# Patient Record
Sex: Male | Born: 1955 | Race: Black or African American | Hispanic: No | Marital: Married | Smoking: Former smoker
Health system: Southern US, Community
[De-identification: ages and names within clinical notes are randomized; demographics above are authoritative.]

## PROBLEM LIST (undated history)

## (undated) DIAGNOSIS — E119 Type 2 diabetes mellitus without complications: Secondary | ICD-10-CM

## (undated) DIAGNOSIS — I10 Essential (primary) hypertension: Secondary | ICD-10-CM

---

## 2007-10-28 ENCOUNTER — Ambulatory Visit: Payer: Self-pay | Admitting: Internal Medicine

## 2007-11-07 ENCOUNTER — Ambulatory Visit: Payer: Self-pay | Admitting: Unknown Physician Specialty

## 2007-11-23 ENCOUNTER — Ambulatory Visit: Payer: Self-pay | Admitting: Unknown Physician Specialty

## 2007-12-24 ENCOUNTER — Ambulatory Visit: Payer: Self-pay | Admitting: Unknown Physician Specialty

## 2009-06-11 ENCOUNTER — Emergency Department: Payer: Self-pay | Admitting: Emergency Medicine

## 2009-08-17 IMAGING — CT CT ABD-PELV W/O CM
1 of 2 series · 16 of 32 positions shown, 20 images · non-contrast
Comparison: none

REASON FOR EXAM: Right side pain, back pain, stones
   Call report to: [phone]
COMMENTS:

[Series 2: stone · axial · 0.82mm/px · z∈[-606,-138]mm · 16 of 170 slices shown, 20 images]
[im 7/170  soft-tissue]
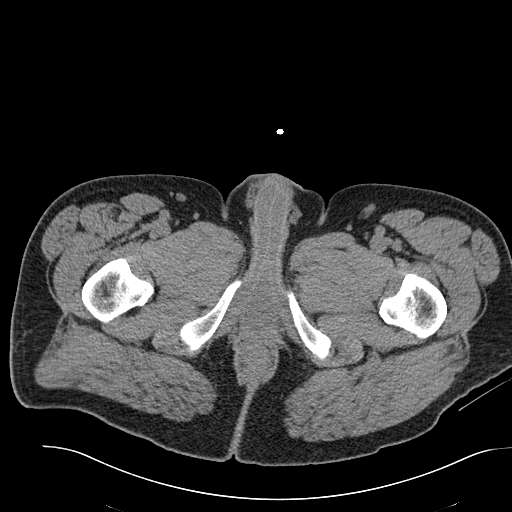
[im 7/170  bone]
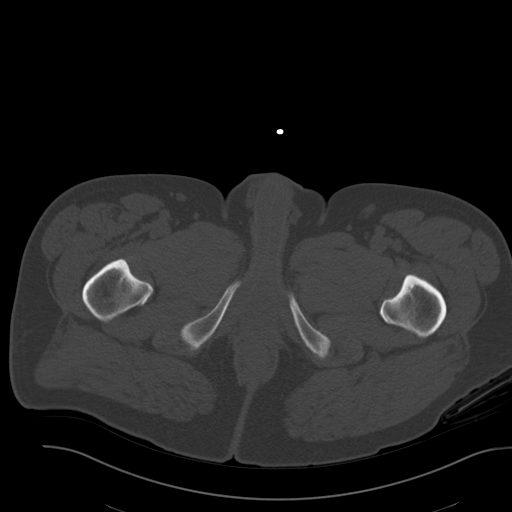
[im 21/170  soft-tissue]
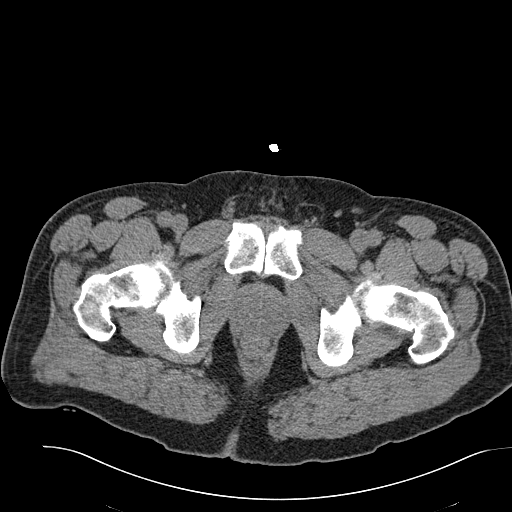
[im 34/170  soft-tissue]
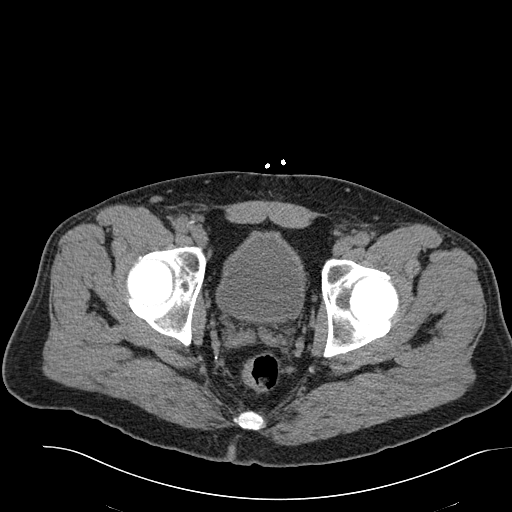
[im 48/170  soft-tissue]
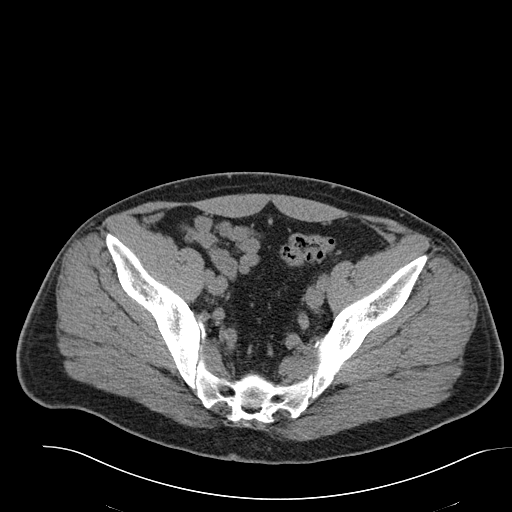
[im 55/170  soft-tissue]
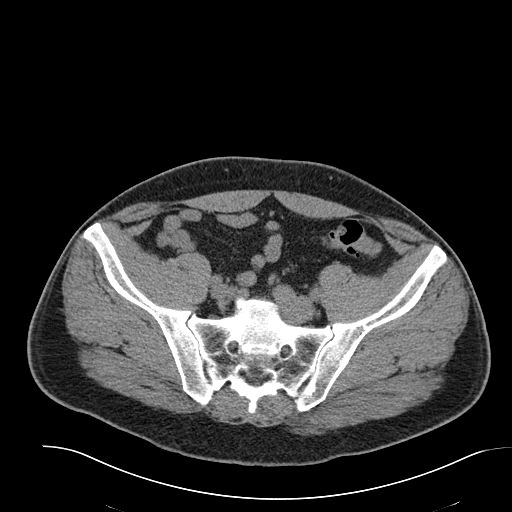
[im 68/170  soft-tissue]
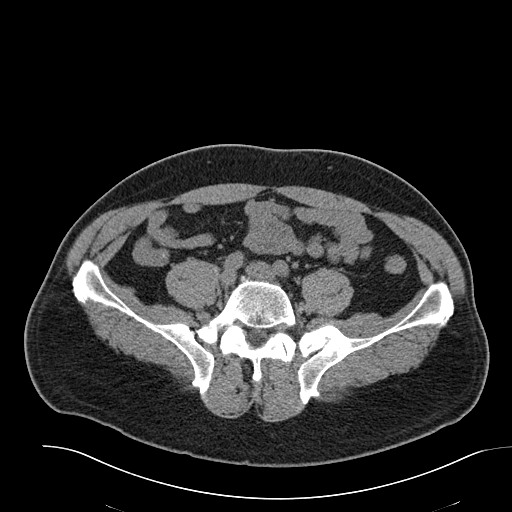
[im 82/170  soft-tissue]
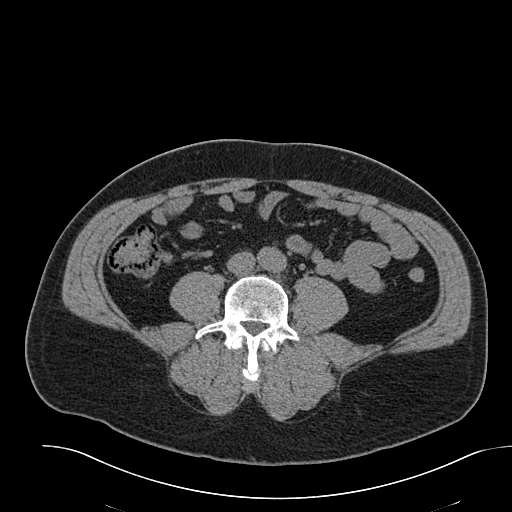
[im 88/170  soft-tissue]
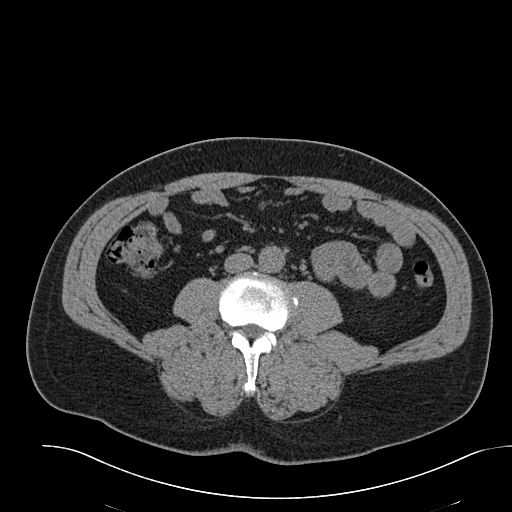
[im 102/170  soft-tissue]
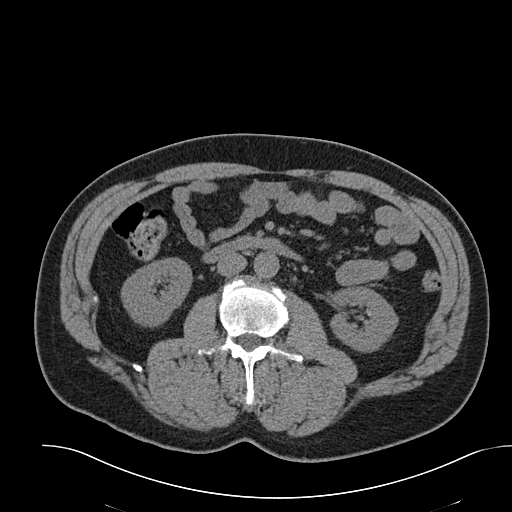
[im 102/170  bone]
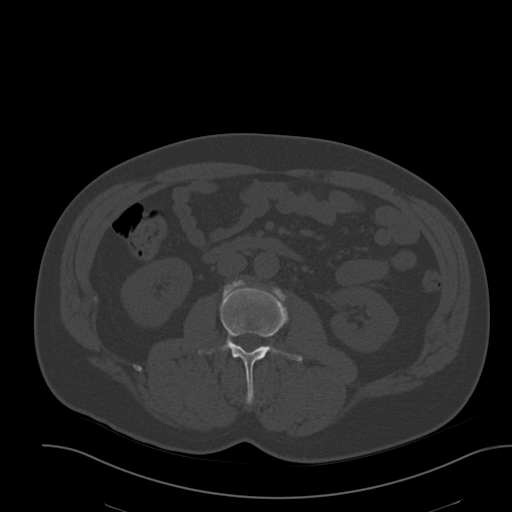
[im 115/170  soft-tissue]
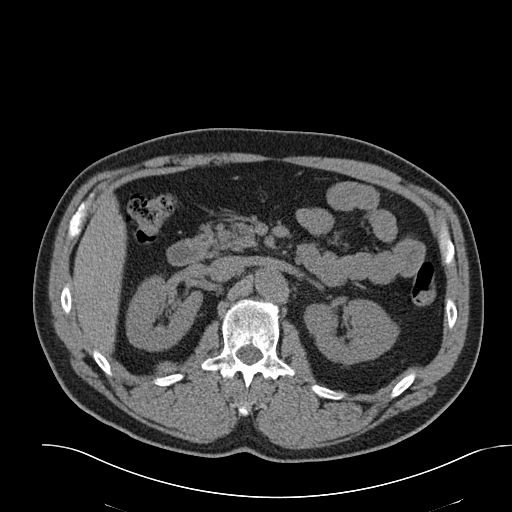
[im 129/170  soft-tissue]
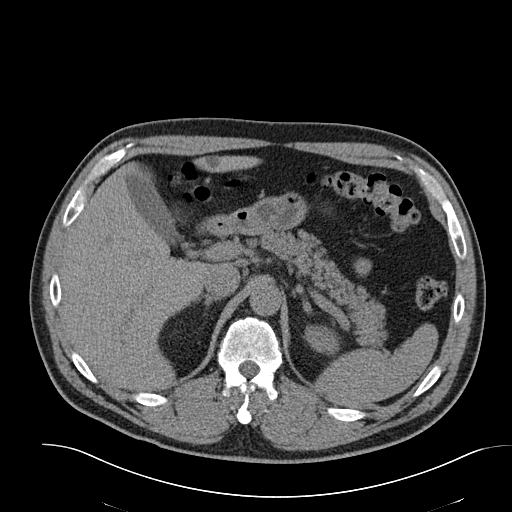
[im 136/170  soft-tissue]
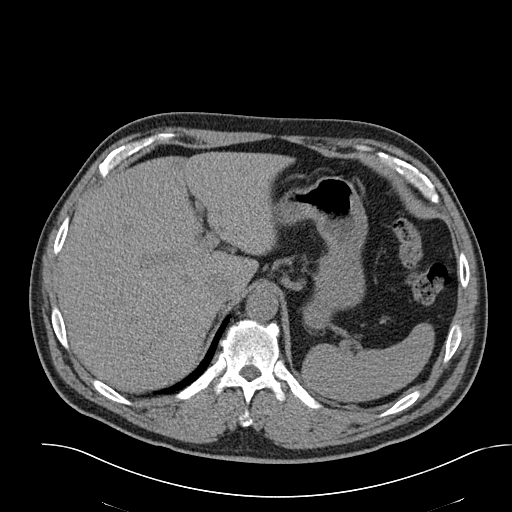
[im 142/170  lung]
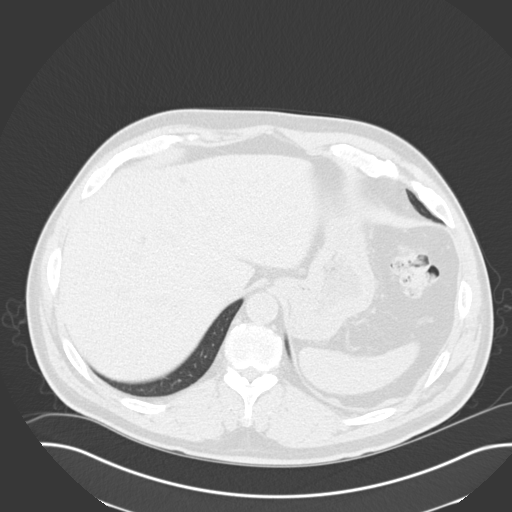
[im 149/170  soft-tissue]
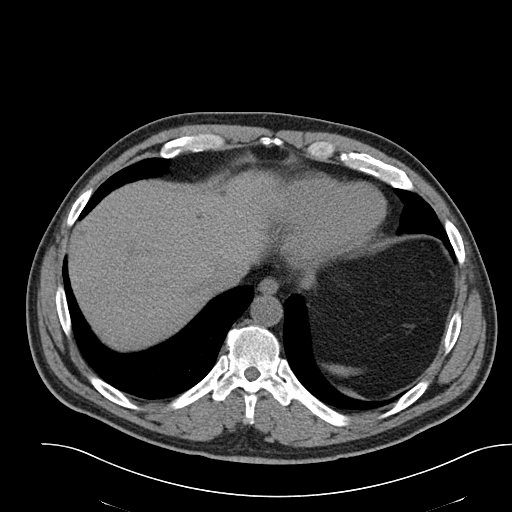
[im 149/170  lung]
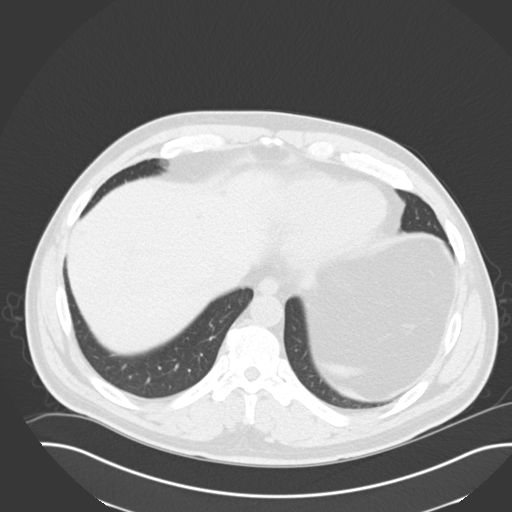
[im 156/170  lung]
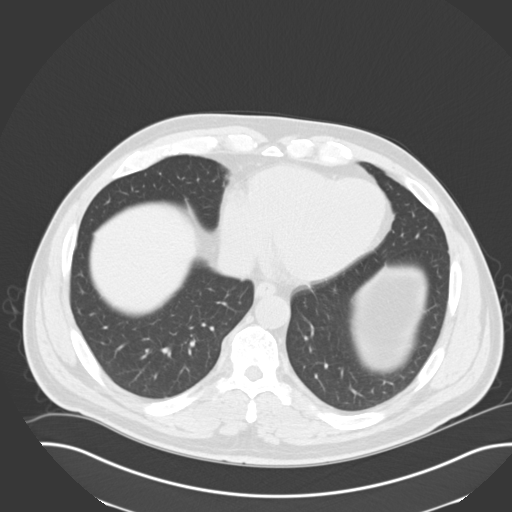
[im 163/170  soft-tissue]
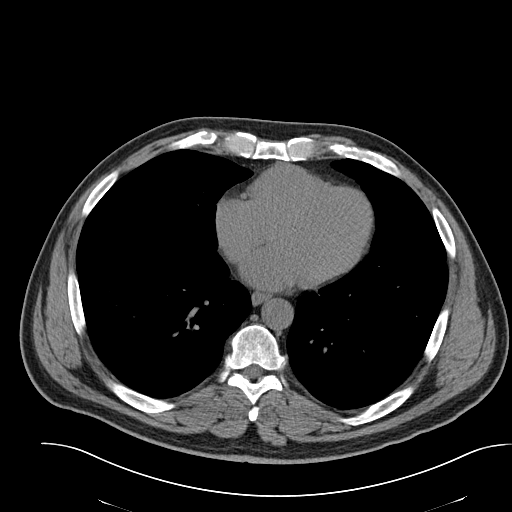
[im 163/170  lung]
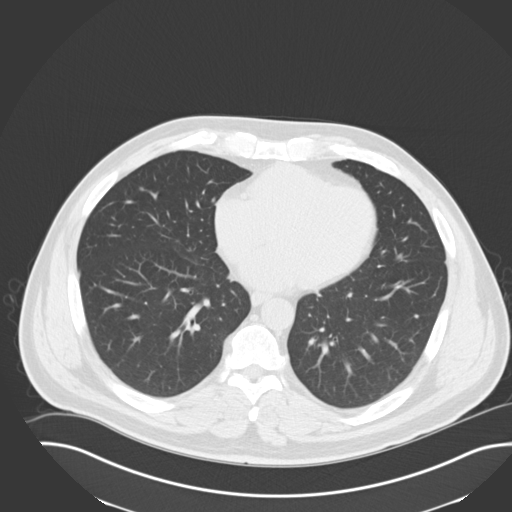

[16 of 32 positions shown; findings below may reference images not displayed]

PROCEDURE:     CT  - CT ABDOMEN AND PELVIS W[DATE] [DATE]

RESULT:     Helical, 3.0 mm, noncontrast sections were obtained from the
lung bases through the pubic symphysis.

Evaluation of the lung bases demonstrates no gross abnormalities.

The liver, spleen, adrenals and pancreas are unremarkable. Evaluation of the
kidneys demonstrates no evidence of hydronephrosis, nephrolithiasis,
hydroureter or ureterolithiasis. There does not appear to be CT evidence of
bowel obstruction, colitis, enteritis, diverticulitis or secondary signs to
suggest appendicitis. There is no CT evidence of an abdominal aortic
aneurysm.
IMPRESSION: No evidence of focal or acute intra-abdominal or
intrapelvic abnormalities.

## 2010-09-28 ENCOUNTER — Emergency Department: Payer: Self-pay | Admitting: Emergency Medicine

## 2010-09-28 ENCOUNTER — Ambulatory Visit: Payer: Self-pay | Admitting: Family Medicine

## 2013-04-28 ENCOUNTER — Ambulatory Visit: Payer: Self-pay | Admitting: Physician Assistant

## 2014-03-03 ENCOUNTER — Ambulatory Visit: Payer: Self-pay | Admitting: Family Medicine

## 2014-07-01 ENCOUNTER — Ambulatory Visit: Payer: Self-pay | Admitting: Pain Medicine

## 2014-07-08 ENCOUNTER — Ambulatory Visit: Payer: Self-pay | Admitting: Pain Medicine

## 2014-07-20 ENCOUNTER — Ambulatory Visit: Payer: Self-pay | Admitting: Pain Medicine

## 2014-08-06 ENCOUNTER — Ambulatory Visit: Admit: 2014-08-06 | Payer: Self-pay | Admitting: Pain Medicine

## 2014-09-15 ENCOUNTER — Other Ambulatory Visit: Payer: Self-pay | Admitting: Gastroenterology

## 2014-09-15 DIAGNOSIS — B182 Chronic viral hepatitis C: Secondary | ICD-10-CM

## 2014-09-18 ENCOUNTER — Ambulatory Visit
Admission: RE | Admit: 2014-09-18 | Discharge: 2014-09-18 | Disposition: A | Discharge status: Home / Self Care | Payer: BLUE CROSS/BLUE SHIELD | Source: Ambulatory Visit | Attending: Gastroenterology | Admitting: Gastroenterology

## 2014-09-18 DIAGNOSIS — B182 Chronic viral hepatitis C: Secondary | ICD-10-CM | POA: Insufficient documentation

## 2014-09-22 ENCOUNTER — Ambulatory Visit: Admission: RE | Admit: 2014-09-22 | Payer: BLUE CROSS/BLUE SHIELD | Source: Ambulatory Visit

## 2014-09-25 ENCOUNTER — Ambulatory Visit
Admission: RE | Admit: 2014-09-25 | Discharge: 2014-09-25 | Disposition: A | Discharge status: Home / Self Care | Payer: BLUE CROSS/BLUE SHIELD | Source: Ambulatory Visit | Attending: Gastroenterology | Admitting: Gastroenterology

## 2014-09-25 DIAGNOSIS — B182 Chronic viral hepatitis C: Secondary | ICD-10-CM

## 2015-02-16 IMAGING — CR DG LUMBAR SPINE COMPLETE 4+V
1 series · 5 of 5 positions shown · non-contrast
Comparison: None.

CLINICAL DATA: Low back pain.  No history trauma provided.

EXAM:
LUMBAR SPINE - COMPLETE 4+ VIEW

[Series 1: ap · 0.17mm/px · 5 of 5 slices shown]
[im 1/5]
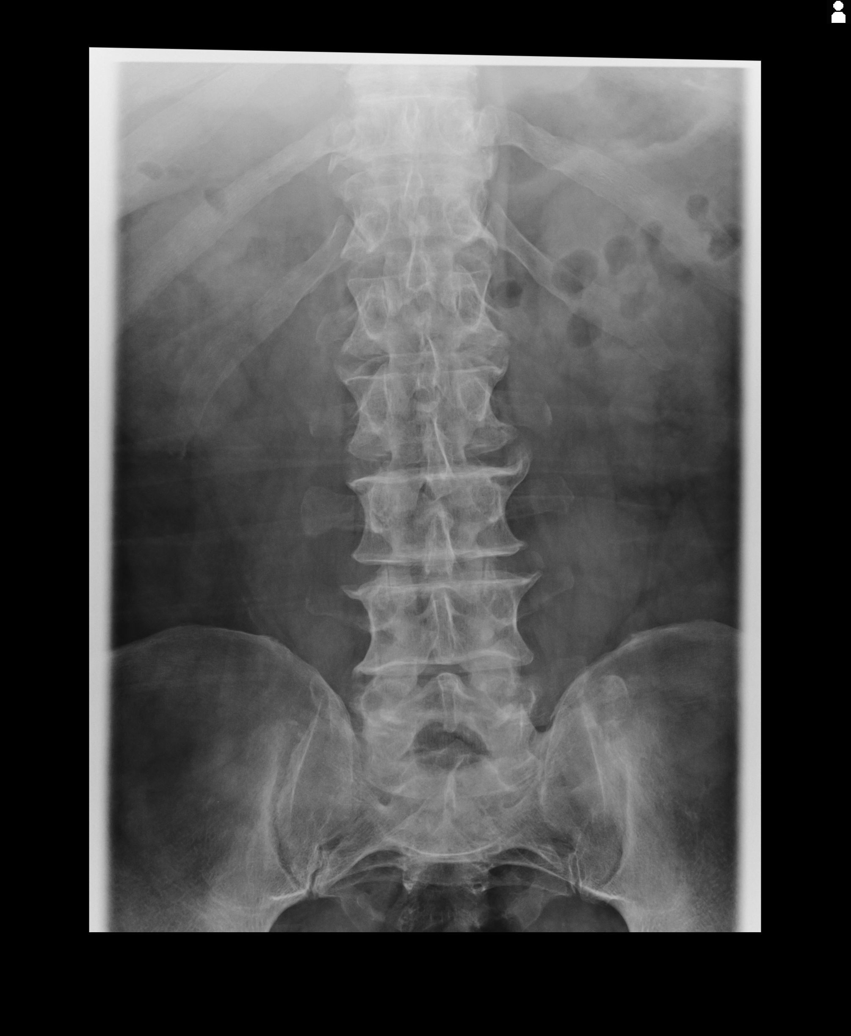
[im 2/5]
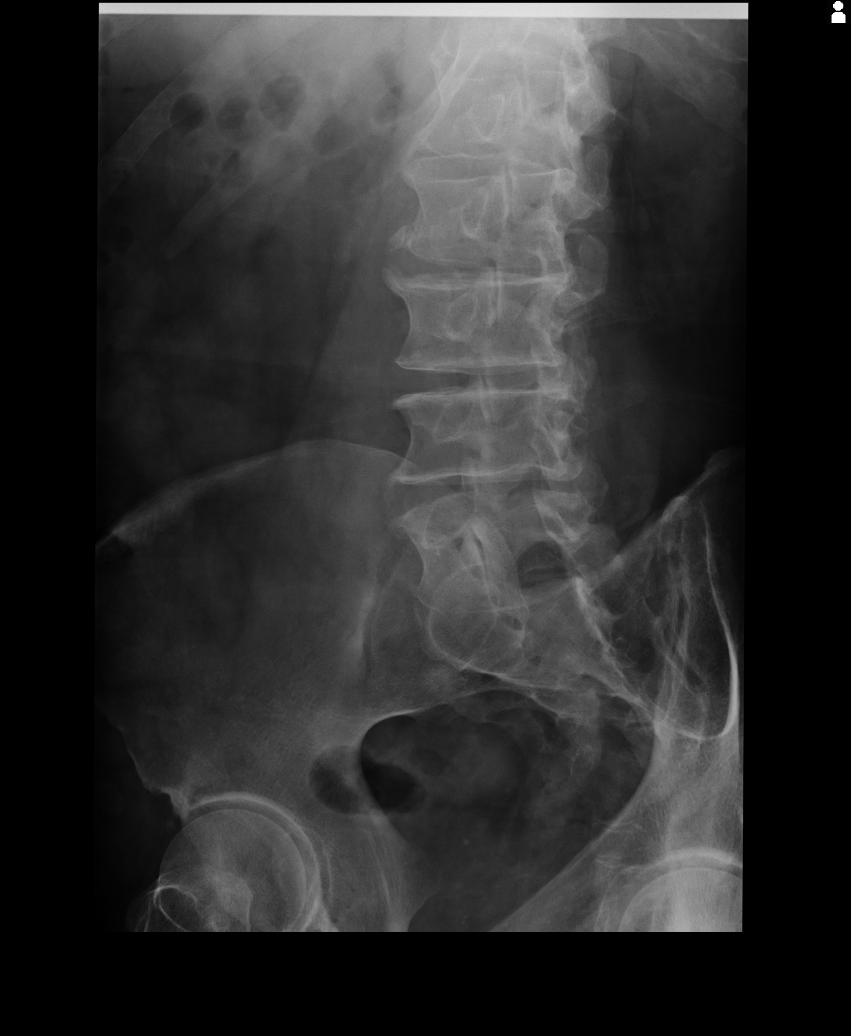
[im 3/5]
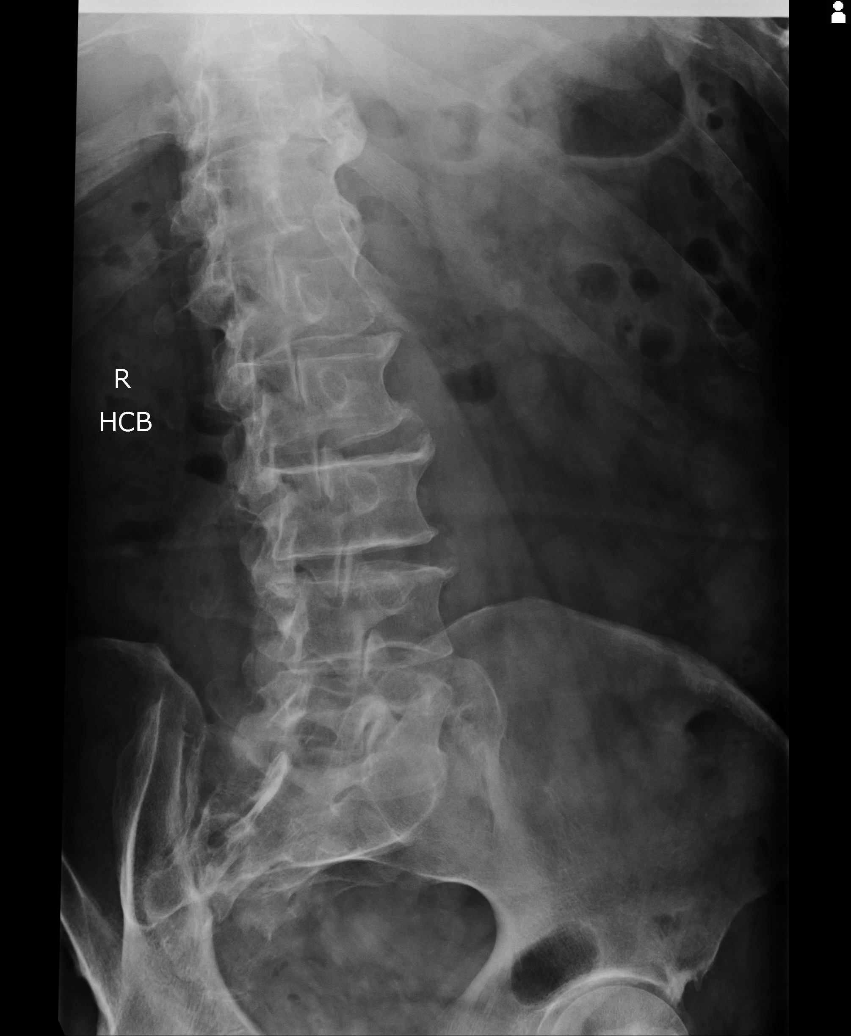
[im 4/5]
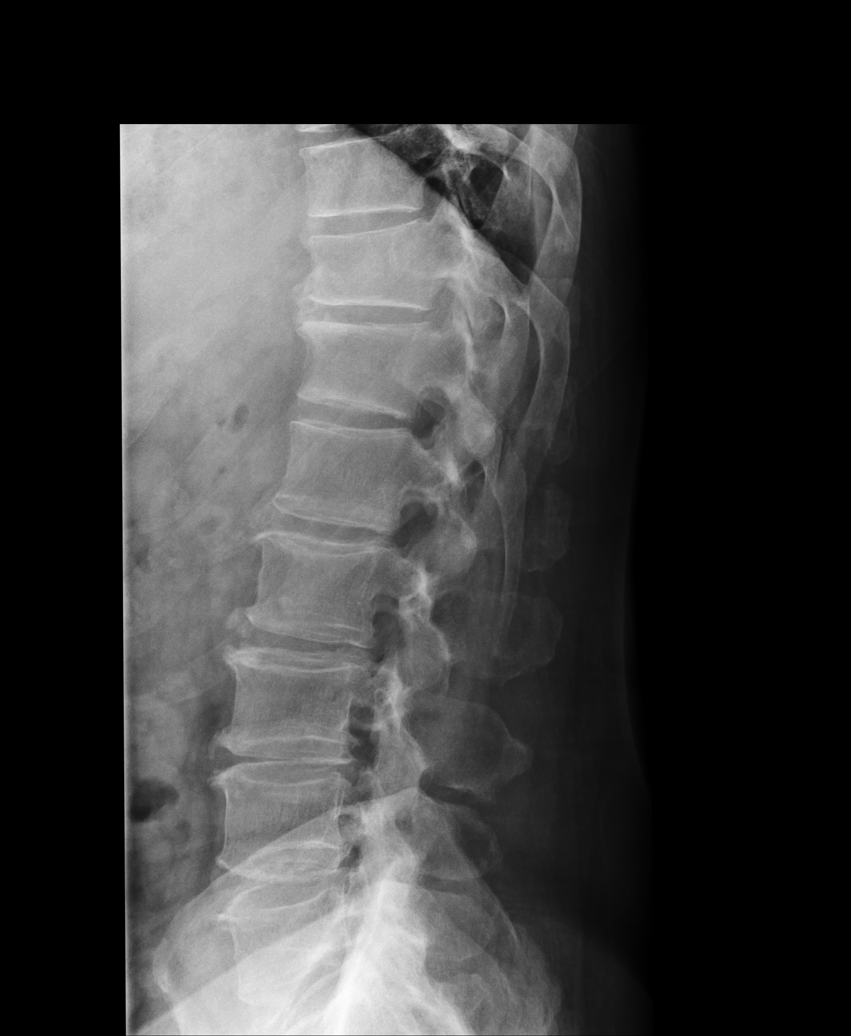
[im 5/5]
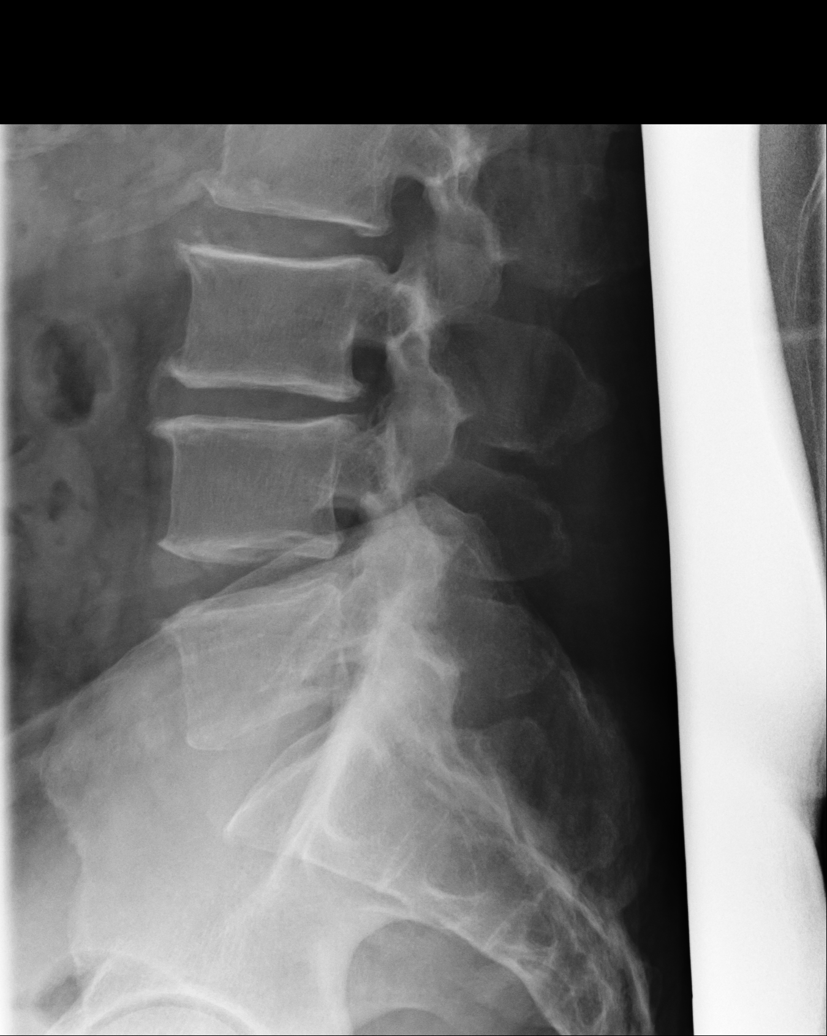

[5 of 5 positions shown; findings below may reference images not displayed]

FINDINGS: Mild L3-4 disc space narrowing.

Minimal L1-2, L2-3 and L4-5 disc space narrowing.

No pars defect noted.

Very mild L5-S1 facet joint degenerative changes.

Mild anterior wedging T11 vertebra felt to be congenital. Minimal
T11-12 anterior disc space narrowing and anterior osteophyte.
IMPRESSION: Mild L3-4 disc space narrowing.

Minimal L1-2, L2-3 and L4-5 disc space narrowing.=

Very mild L5-S1 facet joint degenerative changes.

Mild anterior wedging T11 vertebra felt to be congenital. Minimal
T11-12 anterior disc space narrowing and anterior osteophyte.

## 2015-12-08 ENCOUNTER — Ambulatory Visit
Admission: EM | Admit: 2015-12-08 | Discharge: 2015-12-08 | Disposition: A | Discharge status: Home / Self Care | Payer: Managed Care, Other (non HMO) | Attending: Family Medicine | Admitting: Family Medicine

## 2015-12-08 ENCOUNTER — Encounter: Payer: Self-pay | Admitting: Emergency Medicine

## 2015-12-08 DIAGNOSIS — M533 Sacrococcygeal disorders, not elsewhere classified: Secondary | ICD-10-CM | POA: Diagnosis not present

## 2015-12-08 DIAGNOSIS — M5431 Sciatica, right side: Secondary | ICD-10-CM | POA: Diagnosis not present

## 2015-12-08 DIAGNOSIS — M5441 Lumbago with sciatica, right side: Secondary | ICD-10-CM

## 2015-12-08 HISTORY — DX: Essential (primary) hypertension: I10

## 2015-12-08 HISTORY — DX: Type 2 diabetes mellitus without complications: E11.9

## 2015-12-08 MED ORDER — OXYCODONE-ACETAMINOPHEN 5-325 MG PO TABS: 2.0000 | ORAL_TABLET | Freq: Three times a day (TID) | ORAL | 0 refills | Status: AC | PRN

## 2015-12-08 MED ORDER — ORPHENADRINE CITRATE ER 100 MG PO TB12: 100.0000 mg | ORAL_TABLET | Freq: Two times a day (BID) | ORAL | 0 refills | Status: AC

## 2015-12-08 MED ORDER — KETOROLAC TROMETHAMINE 60 MG/2ML IM SOLN
60.0000 mg | Freq: Once | INTRAMUSCULAR | Status: AC
  Administered 2015-12-08: 60 mg via INTRAMUSCULAR

## 2015-12-08 MED ORDER — MELOXICAM 15 MG PO TABS
15.0000 mg | ORAL_TABLET | Freq: Every day | ORAL | 0 refills | Status: AC
Start: 2015-12-08 — End: ?

## 2015-12-08 NOTE — ED Provider Notes (Signed)
[facility]    CSN: [id number] Arrival date & time: 12/08/15  1240  First Provider Contact:  First MD Initiated Contact with Patient 12/08/15 1325        History   Chief Complaint Chief Complaint  Patient presents with  . Back Pain    HPI Drew Baker is a 60 y.o. male.   Patient's here because of right back pain sac pain. He reports pain goes down his right leg. This is a problem that he's had for over a year. He's had joint injections which did not help. He states by your ago he had MRI which showed some degenerative disease. He states that they were told not to have surgery at this time. Reports this pain started about 2-3 days ago and just simple actions will cause the pain to flare. He's been unable to shake this pain. He is a previous smoker. No pertinent family medical history related to this visit. No previous surgeries.   The history is provided by the patient. No language interpreter was used.  Back Pain  Location:  Sacro-iliac joint Quality:  Aching Radiates to:  R thigh, R foot and R knee Pain severity:  Severe Pain is:  Same all the time Onset quality:  Unable to specify Timing:  Constant Progression:  Waxing and waning Chronicity:  Recurrent Context: not emotional stress, not physical stress, not recent illness, not recent injury and not twisting   Relieved by:  Nothing Worsened by:  Movement Ineffective treatments:  None tried Associated symptoms: leg pain and numbness     Past Medical History:  Diagnosis Date  . Diabetes mellitus without complication (HCC)   . Hypertension     There are no active problems to display for this patient.   History reviewed. No pertinent surgical history.     Home Medications    Prior to Admission medications   Medication Sig Start Date End Date Taking? Authorizing Provider  aspirin 81 MG tablet Take 81 mg by mouth daily.   Yes Historical Provider, MD  gabapentin (NEURONTIN) 600 MG tablet  Take 600 mg by mouth 3 (three) times daily.   Yes Historical Provider, MD  hydrochlorothiazide (HYDRODIURIL) 25 MG tablet Take 25 mg by mouth daily.   Yes Historical Provider, MD  Ledipasvir-Sofosbuvir (HARVONI) 90-400 MG TABS Take 1 tablet by mouth daily.   Yes Historical Provider, MD  losartan (COZAAR) 100 MG tablet Take 100 mg by mouth daily.   Yes Historical Provider, MD  metFORMIN (GLUCOPHAGE) 500 MG tablet Take 500 mg by mouth daily with breakfast.   Yes Historical Provider, MD  PARoxetine (PAXIL) 20 MG tablet Take 20 mg by mouth daily.   Yes Historical Provider, MD  sildenafil (VIAGRA) 50 MG tablet Take 50 mg by mouth daily as needed for erectile dysfunction.   Yes Historical Provider, MD  meloxicam (MOBIC) 15 MG tablet Take 1 tablet (15 mg total) by mouth daily. 12/08/15   Hassan Rowan, MD  orphenadrine (NORFLEX) 100 MG tablet Take 1 tablet (100 mg total) by mouth 2 (two) times daily. 12/08/15   Hassan Rowan, MD  oxyCODONE-acetaminophen (PERCOCET/ROXICET) 5-325 MG tablet Take 2 tablets by mouth every 8 (eight) hours as needed for severe pain. 12/08/15   Hassan Rowan, MD    Family History History reviewed. No pertinent family history.  Social History Social History  Substance Use Topics  . Smoking status: Former Games developer  . Smokeless tobacco: Never Used  . Alcohol use Yes  Allergies   Review of patient's allergies indicates no known allergies.   Review of Systems Review of Systems  Musculoskeletal: Positive for back pain.  Neurological: Positive for numbness.  All other systems reviewed and are negative.    Physical Exam Triage Vital Signs ED Triage Vitals  Enc Vitals Group     BP 12/08/15 1254 132/84     Pulse Rate 12/08/15 1254 69     Resp 12/08/15 1254 16     Temp 12/08/15 1254 97.7 F (36.5 C)     Temp Source 12/08/15 1254 Oral     SpO2 12/08/15 1254 100 %     Weight 12/08/15 1255 220 lb (99.8 kg)     Height 12/08/15 1255 6' (1.829 m)     Head Circumference --        Peak Flow --      Pain Score 12/08/15 1256 10     Pain Loc --      Pain Edu? --      Excl. in GC? --    No data found.   Updated Vital Signs BP 132/84 (BP Location: Left Arm)   Pulse 69   Temp 97.7 F (36.5 C) (Oral)   Resp 16   Ht 6' (1.829 m)   Wt 220 lb (99.8 kg)   SpO2 100%   BMI 29.84 kg/m   Visual Acuity Right Eye Distance:   Left Eye Distance:   Bilateral Distance:    Right Eye Near:   Left Eye Near:    Bilateral Near:     Physical Exam  Constitutional: He is oriented to person, place, and time. He appears well-developed and well-nourished.  HENT:  Head: Normocephalic and atraumatic.  Eyes: Pupils are equal, round, and reactive to light.  Neck: Normal range of motion.  Pulmonary/Chest: Effort normal.  Musculoskeletal: Normal range of motion.       Lumbar back: He exhibits tenderness, pain and spasm.       Back:  Patient is tenderness over the right iliac sacral area consistent with a sacroiliitis  Neurological: He is alert and oriented to person, place, and time.  Skin: Skin is warm.  Psychiatric: He has a normal mood and affect.  Vitals reviewed.    UC Treatments / Results  Labs (all labs ordered are listed, but only abnormal results are displayed) Labs Reviewed - No data to display  EKG  EKG Interpretation None       Radiology No results found.  Procedures Procedures (including critical care time)  Medications Ordered in UC Medications  ketorolac (TORADOL) injection 60 mg (60 mg Intramuscular Given 12/08/15 1347)     Initial Impression / Assessment and Plan / UC Course  I have reviewed the triage vital signs and the nursing notes.  Pertinent labs & imaging results that were available during my care of the patient were reviewed by me and considered in my medical decision making (see chart for details).  Clinical Course    Patient reports last Percocet was back in April cannot find any recent prescriptions for him we'll check  the colonic databank. This was appears be consistent as far as narcotics. States that he's been avoiding narcotics with his PCP at this time he is willing to look for a more when necessary pain medication prescription will give him 10 Percocet tablets. Mobic 15 mg 1 tablet daily Norflex 100 mg twice a day. Would normally like to try either injection of Solu-Medrol 60 course of prednisone but  he states that prednisone causes him to become very dizzy and nonfunctional so he declined that. We'll give him a work note for today and tomorrow and will give him 60 Toradol IM. Recommend follow-up with his PCP sometime this month or next month.  Final Clinical Impressions(s) / UC Diagnoses   Final diagnoses:  Sciatica of right side  Sacroiliac joint pain  Right-sided low back pain with right-sided sciatica    New Prescriptions New Prescriptions   MELOXICAM (MOBIC) 15 MG TABLET    Take 1 tablet (15 mg total) by mouth daily.   ORPHENADRINE (NORFLEX) 100 MG TABLET    Take 1 tablet (100 mg total) by mouth 2 (two) times daily.   OXYCODONE-ACETAMINOPHEN (PERCOCET/ROXICET) 5-325 MG TABLET    Take 2 tablets by mouth every 8 (eight) hours as needed for severe pain.     Hassan RowanEugene Kaylor Maiers, MD 12/08/15 [phone]

## 2015-12-08 NOTE — ED Notes (Signed)
Patient waited 15 minutes and no reactions were noted. Injection site is unremarkable. 

## 2015-12-08 NOTE — ED Triage Notes (Signed)
Patient c/o lower back pain off and on for the past year.  Patient states that his pain got worse on Monday.  Patient reports pain in worse when he bends down and it goes down his right leg.

## 2016-07-15 IMAGING — US US LIVER ELASTOGRAPHY
1 series · 13 of 22 positions shown · non-contrast
Comparison: None.

CLINICAL DATA: Chronic hepatitis-C

EXAM:
ULTRASOUND HEPATIC ELASTOGRAPHY
TECHNIQUE: Ultrasound elastography evaluation of the liver was performed. A
region of interest was placed in the right lobe of the liver.
Following application of a compressive sonographic pulse, shear
waves were detected in the adjacent hepatic tissue and the shear
wave velocity was calculated. Multiple assessments were performed at
the selected site. Median shear wave velocity is correlated to a
Metavir fibrosis score.

[Series 1: us liver elastography · 0.27mm/px · 13 of 22 slices shown]
[im 1/22]
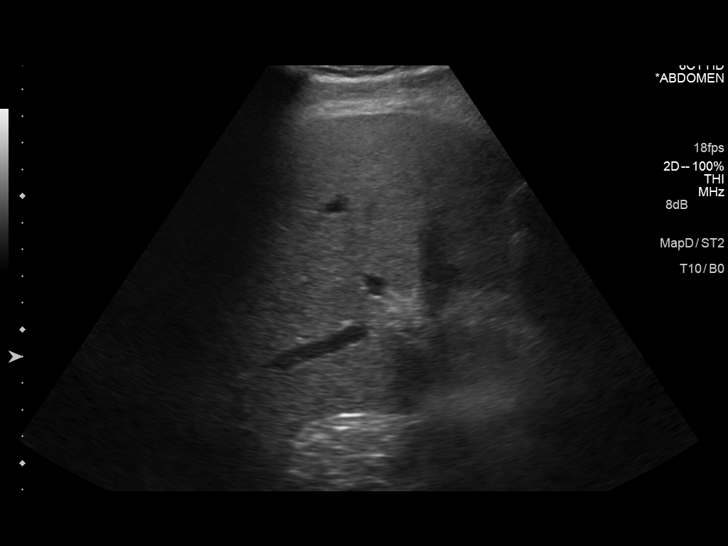
[im 3/22]
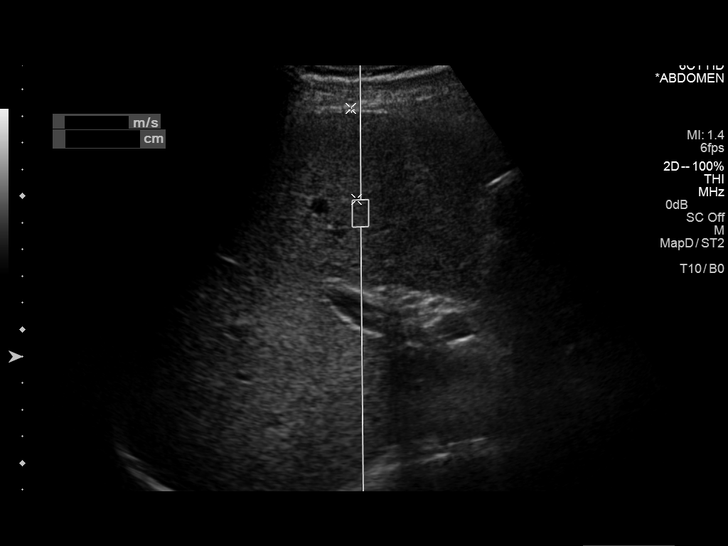
[im 5/22]
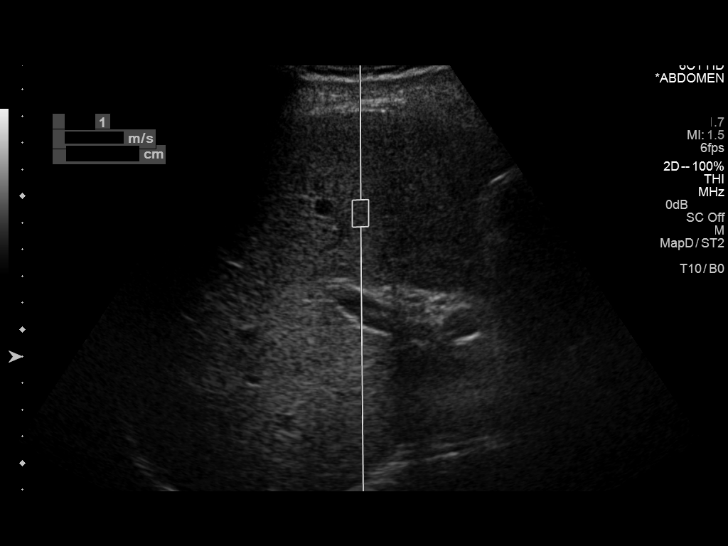
[im 6/22]
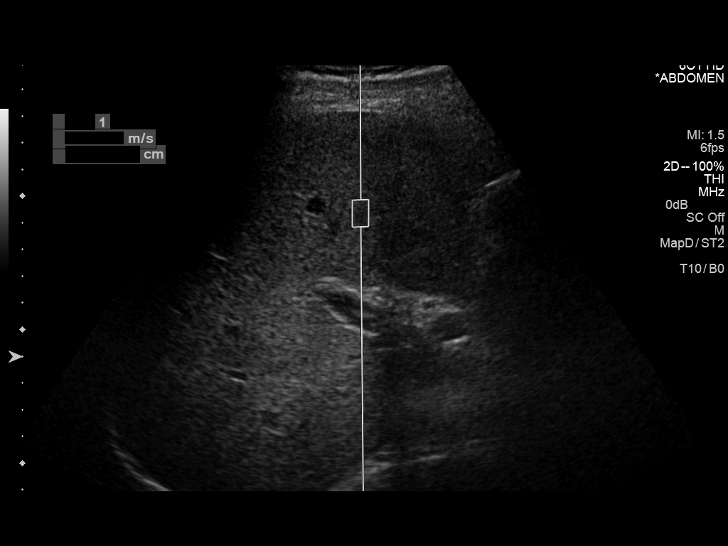
[im 8/22]
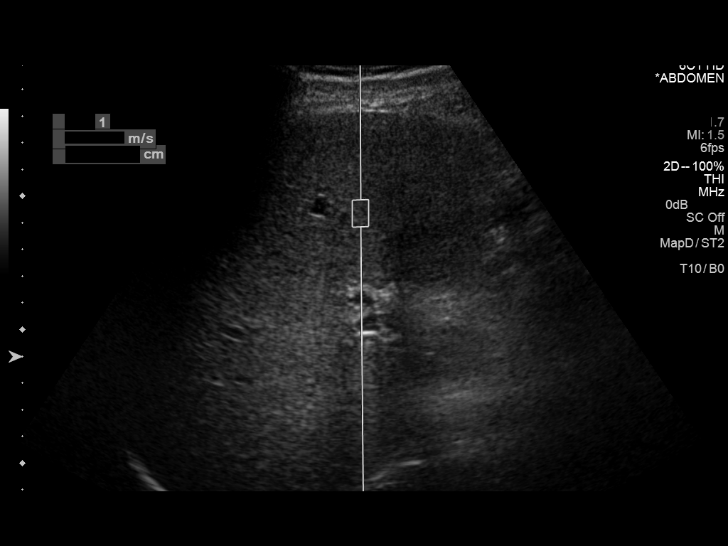
[im 10/22]
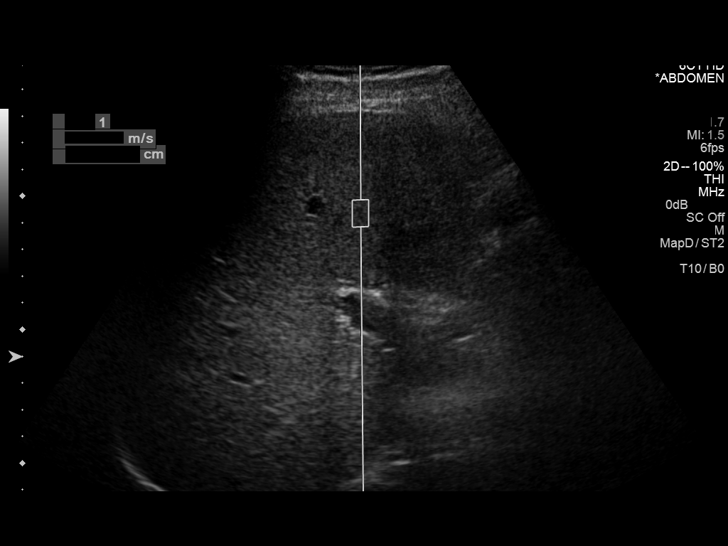
[im 12/22]
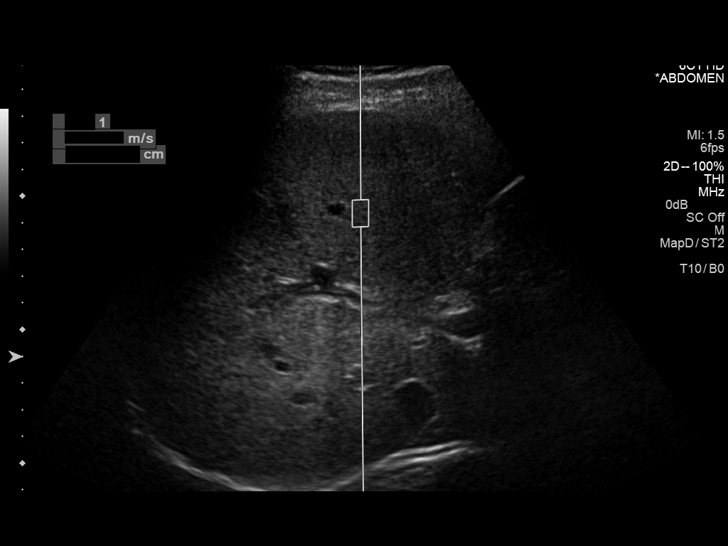
[im 13/22]
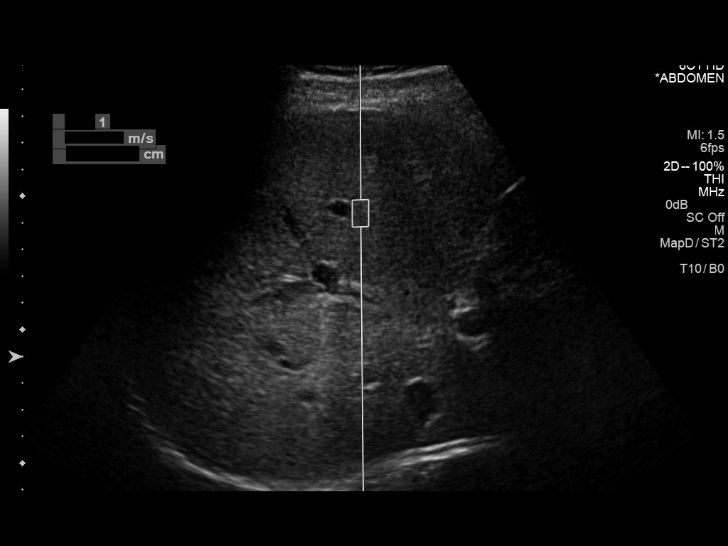
[im 15/22]
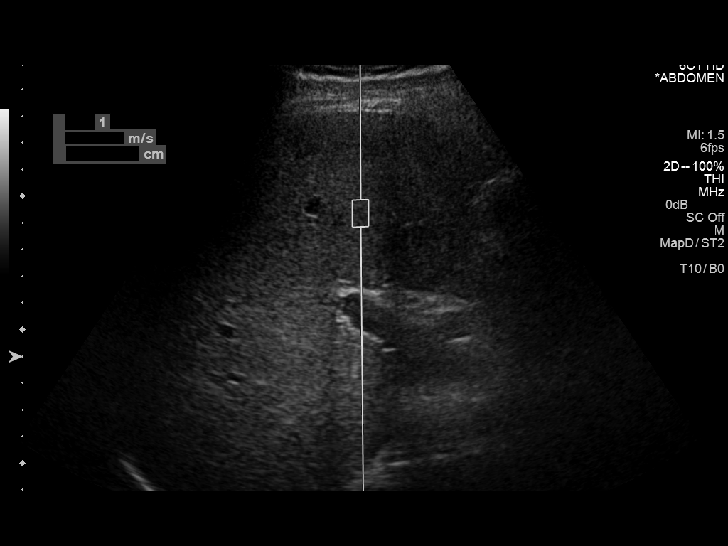
[im 17/22]
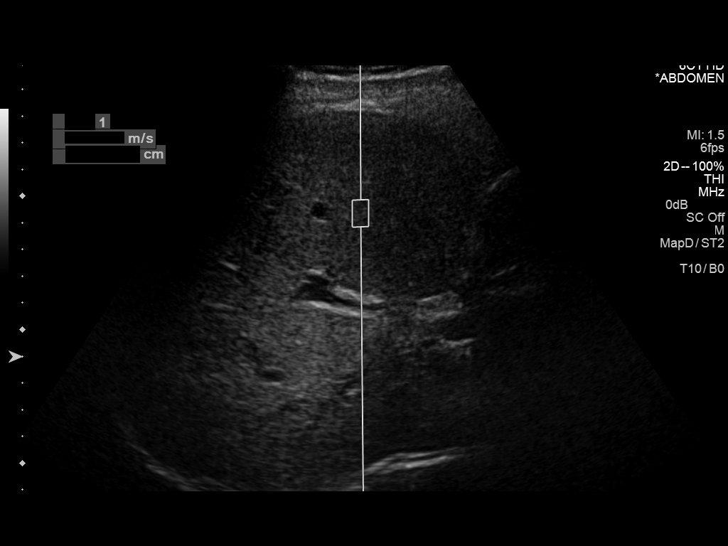
[im 18/22]
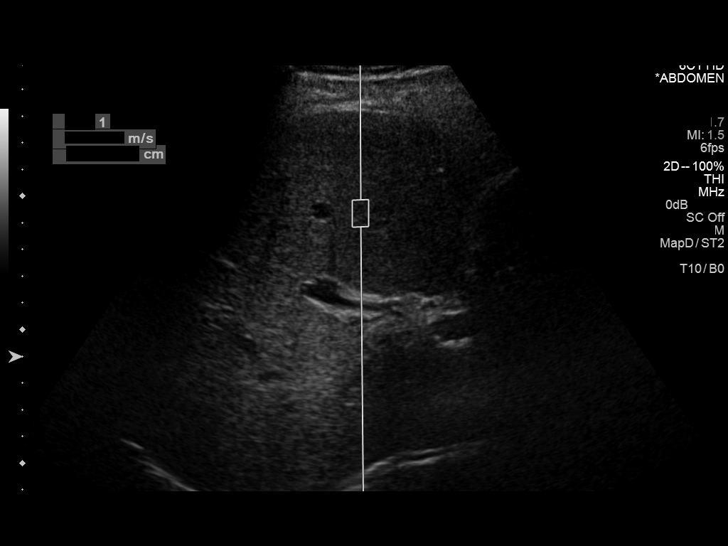
[im 20/22]
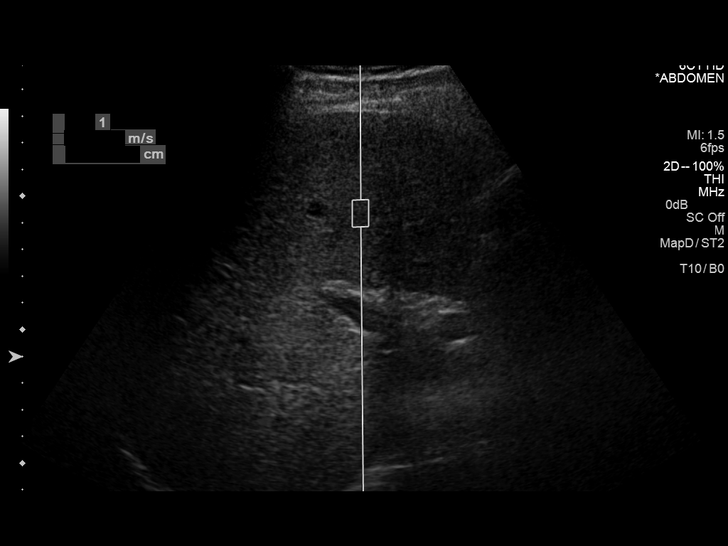
[im 22/22]
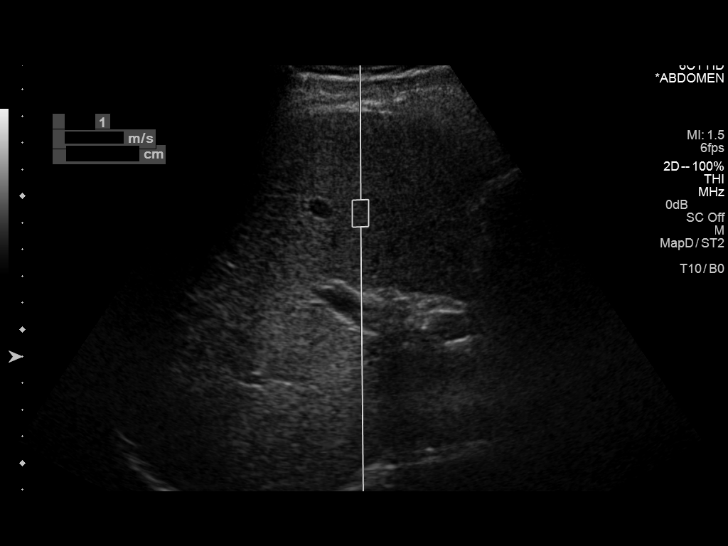

[13 of 22 positions shown; findings below may reference images not displayed]

FINDINGS: Device: Siemens Helix VTQ

Transducer 6 HD

Patient position: Supine

Number of measurements: 10

Hepatic segment:  8

Median velocity:   1.88  m/sec

IQR:

IQR/Median velocity ratio:

Corresponding Metavir fibrosis score:  F 2 and some F 3

Risk of fibrosis: Moderate

Limitations of exam: None

Pertinent findings noted on other imaging exams:  None

Please note that abnormal shear wave velocities may also be
identified in clinical settings other than with hepatic fibrosis,
such as: acute hepatitis, elevated right heart and central venous
pressures including use of beta blockers, Jie disease
(Bogdan), infiltrative processes such as
mastocytosis/amyloidosis/infiltrative tumor, extrahepatic
cholestasis, in the post-prandial state, and liver transplantation.
Correlation with patient history, laboratory data, and clinical
condition recommended.
IMPRESSION: Median hepatic shear wave velocity is calculated at 1.88 m/sec.

Corresponding Metavir fibrosis score is  F 2 and some F 3.

Risk of fibrosis is moderate.

Follow-up: Additional testing appropriate
